# Patient Record
Sex: Female | Born: 1965 | Race: White | Hispanic: No | Marital: Single
Health system: Southern US, Community
[De-identification: ages and names within clinical notes are randomized; demographics above are authoritative.]

---

## 2019-05-29 ENCOUNTER — Emergency Department (HOSPITAL_COMMUNITY)
Admission: EM | Admit: 2019-05-29 | Discharge: 2019-05-29 | Disposition: A | Discharge status: Home / Self Care | Payer: No Typology Code available for payment source | Attending: Emergency Medicine | Admitting: Emergency Medicine

## 2019-05-29 ENCOUNTER — Emergency Department (HOSPITAL_COMMUNITY): Payer: No Typology Code available for payment source

## 2019-05-29 DIAGNOSIS — Y929 Unspecified place or not applicable: Secondary | ICD-10-CM | POA: Insufficient documentation

## 2019-05-29 DIAGNOSIS — M7918 Myalgia, other site: Secondary | ICD-10-CM | POA: Insufficient documentation

## 2019-05-29 DIAGNOSIS — T07XXXA Unspecified multiple injuries, initial encounter: Secondary | ICD-10-CM

## 2019-05-29 DIAGNOSIS — M545 Low back pain, unspecified: Secondary | ICD-10-CM

## 2019-05-29 DIAGNOSIS — R519 Headache, unspecified: Secondary | ICD-10-CM | POA: Insufficient documentation

## 2019-05-29 DIAGNOSIS — Y939 Activity, unspecified: Secondary | ICD-10-CM | POA: Diagnosis not present

## 2019-05-29 DIAGNOSIS — Y999 Unspecified external cause status: Secondary | ICD-10-CM | POA: Insufficient documentation

## 2019-05-29 DIAGNOSIS — M79644 Pain in right finger(s): Secondary | ICD-10-CM

## 2019-05-29 MED ORDER — HYDROCODONE-ACETAMINOPHEN 5-325 MG PO TABS: 1.0000 | ORAL_TABLET | Freq: Four times a day (QID) | ORAL | 0 refills | Status: AC | PRN

## 2019-05-29 MED ORDER — KETOROLAC TROMETHAMINE 60 MG/2ML IM SOLN
60.0000 mg | Freq: Once | INTRAMUSCULAR | Status: AC
  Administered 2019-05-29: 60 mg via INTRAMUSCULAR
  Filled 2019-05-29: qty 2
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
## 2019-05-29 NOTE — Progress Notes (Signed)
Orthopedic Tech Progress Note Patient Details:  Stacie Rasmussen [birth year] [id number]  Ortho Devices Type of Ortho Device: Thumb velcro splint Ortho Device/Splint Location: Right thumb splica Ortho Device/Splint Interventions: Ordered, Application   Post Interventions Patient Tolerated: Well Instructions Provided: Adjustment of device, Care of device, Poper ambulation with device   Ortha Metts 05/29/2019, 10:05 PM
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
## 2019-05-30 ENCOUNTER — Telehealth: Payer: Self-pay | Admitting: *Deleted
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
